# Patient Record
Sex: Female | Born: 1985
Health system: Southern US, Community
[De-identification: ages and names within clinical notes are randomized; demographics above are authoritative.]

## PROBLEM LIST (undated history)

## (undated) DIAGNOSIS — J45909 Unspecified asthma, uncomplicated: Secondary | ICD-10-CM

## (undated) DIAGNOSIS — T7840XA Allergy, unspecified, initial encounter: Secondary | ICD-10-CM

## (undated) HISTORY — DX: Allergy, unspecified, initial encounter: T78.40XA

## (undated) HISTORY — DX: Unspecified asthma, uncomplicated: J45.909

---

## 2016-07-25 ENCOUNTER — Ambulatory Visit (INDEPENDENT_AMBULATORY_CARE_PROVIDER_SITE_OTHER): Payer: BLUE CROSS/BLUE SHIELD | Admitting: Family Medicine

## 2016-07-25 ENCOUNTER — Encounter: Payer: Self-pay | Admitting: Family Medicine

## 2016-07-25 VITALS — BP 119/74 | HR 90 | Temp 98.6°F | Resp 16 | Ht 66.0 in | Wt 238.0 lb

## 2016-07-25 DIAGNOSIS — J302 Other seasonal allergic rhinitis: Secondary | ICD-10-CM

## 2016-07-25 DIAGNOSIS — Z789 Other specified health status: Secondary | ICD-10-CM

## 2016-07-25 DIAGNOSIS — Z111 Encounter for screening for respiratory tuberculosis: Secondary | ICD-10-CM | POA: Diagnosis not present

## 2016-07-25 DIAGNOSIS — J4520 Mild intermittent asthma, uncomplicated: Secondary | ICD-10-CM | POA: Diagnosis not present

## 2016-07-25 DIAGNOSIS — Z8742 Personal history of other diseases of the female genital tract: Secondary | ICD-10-CM

## 2016-07-25 DIAGNOSIS — Z23 Encounter for immunization: Secondary | ICD-10-CM

## 2016-07-25 LAB — COMPLETE METABOLIC PANEL WITH GFR
ALBUMIN: 4.5 g/dL (ref 3.6–5.1)
ALK PHOS: 44 U/L (ref 33–115)
ALT: 12 U/L (ref 6–29)
AST: 15 U/L (ref 10–30)
BUN: 12 mg/dL (ref 7–25)
CHLORIDE: 103 mmol/L (ref 98–110)
CO2: 26 mmol/L (ref 20–31)
Calcium: 9.5 mg/dL (ref 8.6–10.2)
Creat: 0.72 mg/dL (ref 0.50–1.10)
GFR, Est African American: >89 mL/min (ref >=60)
GLUCOSE: 88 mg/dL (ref 65–99)
POTASSIUM: 4.4 mmol/L (ref 3.5–5.3)
SODIUM: 138 mmol/L (ref 135–146)
Total Bilirubin: 0.3 mg/dL (ref 0.2–1.2)
Total Protein: 7.2 g/dL (ref 6.1–8.1)

## 2016-07-25 MED ORDER — ALBUTEROL SULFATE HFA 108 (90 BASE) MCG/ACT IN AERS: 2.0000 | INHALATION_SPRAY | Freq: Four times a day (QID) | RESPIRATORY_TRACT | 1 refills | Status: AC | PRN

## 2016-07-25 NOTE — Assessment & Plan Note (Signed)
Stable, triggered by allergies/URI and possible mild exercise induced component Refill Albuterol PRN inhaler

## 2016-07-25 NOTE — Assessment & Plan Note (Signed)
BMI >38, no significant family history risk factors, primary modifiable risk factors include lifestyle with limited regular exercise. Encouraged healthy lifestyle, diet/exercise Mutual decision defer lipid panel today, not fasting. Will check on follow-up at next visit age 29+

## 2016-07-25 NOTE — Assessment & Plan Note (Signed)
Resolved

## 2016-07-25 NOTE — Assessment & Plan Note (Signed)
Stable, without problems. No clear known [place] allergen triggers May use OTC anti-histamines PRN

## 2016-07-25 NOTE — Progress Notes (Signed)
Subjective:    Patient ID: Bonnie Pace, female    DOB: 23-Jul-1986, 30 y.o.   MRN: [id number]  Bonnie Pace is a 30 y.o. 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  Seasonal Allergies / H/o Mild Intermittent Asthma - Reports currently doing well, describes childhood history of asthma with one significant flare required hospitalization, thought to be due to respiratory illness. Overall used albuterol inhaler infrequently, sometimes experienced exercise induced asthma symptoms. Requests albuterol inhaler PRN. - Unsure what specific environmental allergens trigger her symptoms, has not been in [place] long enough to experience allergies here - Not taking any anti-histamine medicines currently  H/o Ovarian Cyst (Left) / OB/GYN History: - Reports previously followed by OB/GYN in [place]. Diagnosed with L ovarian cyst due to pelvic/flank pain 1 year ago, treated with hormonal pills with resolution. No further complications. Currently taking OCP, "Levolbel" from [place] which she takes for 3 weeks hormone then 1 week placebo, tolerating well, not requesting new rx or refill, not available in [place], she has plenty will wait for now.  Morbid Obesity, BMI >38 - Reports no new concerns or dramatic change in weight. Not currently following regular exercise regimen. Tries to eat healthy diet. - No known family history of high cholesterol  Health Maintenance: - Due for Flu shot today - Due for TDap today, unsure prior immunization status - No immunization records, and unsure prior MMR or Hep B vaccination status, will need titers - Will need TB screening for public school health form, opted for TB Quantiferon gold    Past Medical History:  Diagnosis Date  . Allergy   . Asthma    Social History    Social History  . Marital status: Single    Spouse name: N/A  . Number of children: N/A  . Years of education: Masters degree   Occupational History  . 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 Smoking status: Never Smoker  . Smokeless tobacco: Never Used  . Alcohol use No  . Drug use: No  . Sexual activity: Not on file   Other Topics Concern  . Not on file   Social History Narrative   Born and raised in [place], parents [place]. Moved to [place] 06/2016.   Family History  Problem Relation Age of Onset  . Ovarian cancer Mother   . Heart attack Father 64    Reported heart valve complication  . Brain cancer Maternal Grandfather    No current outpatient prescriptions on file prior to visit.   No current facility-administered medications on file prior to visit.     Review of Systems  Constitutional: Negative for activity change, appetite change, chills, diaphoresis, fatigue and fever.  HENT: Negative for congestion, hearing loss, sinus pressure, sore throat and trouble swallowing.   Eyes: Negative for visual disturbance.  Respiratory: Negative for cough, chest tightness, shortness of breath and wheezing.   Cardiovascular: Negative for chest pain, palpitations and leg swelling.  Gastrointestinal: Negative for abdominal pain, constipation, diarrhea, nausea and vomiting.  Endocrine: Negative for cold intolerance, polydipsia, polyphagia and polyuria.  Genitourinary: Negative for dysuria, frequency, hematuria, menstrual problem and vaginal discharge.  Musculoskeletal: Negative for arthralgias and back pain.  Skin: Negative for rash.  Neurological: Negative for dizziness, weakness, light-headedness, numbness and headaches.  Hematological:  Negative for adenopathy.  Psychiatric/Behavioral: Negative for behavioral problems and confusion. The patient is not nervous/anxious.    Per HPI unless specifically indicated above     Objective:    BP 119/74 (BP Location: Right  Arm, Patient Position: Sitting, Cuff Size: Large)   Pulse 90   Temp 98.6 F (37 C) (Oral)   Resp 16   Ht 5\' 6"  (1.676 m)   Wt 238 lb (108 kg)   BMI 38.41 kg/m   Wt Readings from Last 3 Encounters:  07/25/16 238 lb (108 kg)    Physical Exam  Constitutional: She is oriented to person, place, and time. She appears well-developed and well-nourished. No distress.  Well-appearing, comfortable, cooperative, very pleasant, overweight  HENT:  Head: Normocephalic and atraumatic.  Mouth/Throat: Oropharynx is clear and moist.  Eyes: Conjunctivae and EOM are normal. Pupils are equal, round, and reactive to light.  Neck: Normal range of motion. Neck supple. No thyromegaly present.  Cardiovascular: Normal rate, regular rhythm, normal heart sounds and intact distal pulses.   No murmur heard. Pulmonary/Chest: Effort normal and breath sounds normal. No respiratory distress. She has no wheezes. She has no rales.  Abdominal: Soft. Bowel sounds are normal. She exhibits no distension and no mass. There is no tenderness.  Genitourinary:  Genitourinary Comments: Deferred pelvic exam today, last pelvic / pap completed 02/2016  Musculoskeletal: Normal range of motion. She exhibits no edema or tenderness.  Back normal without deformity or abnormal curvature.  Upper / Lower Extremities: - Normal muscle tone, strength bilateral upper extremities 5/5, lower extremities 5/5 - Normal Gait - Bilateral shoulders, knees, wrist, ankles without deformity, tenderness, effusion  Lymphadenopathy:    She has no cervical adenopathy.  Neurological: She is alert and oriented to person, place, and time.  Skin: Skin is warm and dry. No rash noted. She is not diaphoretic.  Psychiatric: She has a normal mood and affect. Her behavior is normal.  Nursing note and vitals reviewed.  No results found for this or any previous visit.    Assessment & Plan:   Problem List Items Addressed This Visit    Seasonal allergies -  Primary    Stable, without problems. No clear known 03/2016 allergen triggers May use OTC anti-histamines PRN      Morbid obesity (HCC)    BMI >38, no significant family history risk factors, primary modifiable risk factors include lifestyle with limited regular exercise. Encouraged healthy lifestyle, diet/exercise Mutual decision defer lipid panel today, not fasting. Will check on follow-up at next visit age 28+      Relevant Orders   COMPLETE METABOLIC PANEL WITH GFR   Mild intermittent asthma    Stable, triggered by allergies/URI and possible mild exercise induced component Refill Albuterol PRN inhaler      Relevant Medications   albuterol (PROVENTIL HFA;VENTOLIN HFA) 108 (90 Base) MCG/ACT inhaler   History of ovarian cyst    Resolved.       Other Visit Diagnoses    Hepatitis B vaccination status unknown       Relevant Orders   Hepatitis B Surface AntiBODY   Measles, mumps, rubella (MMR) vaccination status unknown       Relevant Orders   Measles/Mumps/Rubella Immunity   Needs flu shot       Relevant Orders   Flu Vaccine QUAD 36+ mos IM (Completed)   Need for diphtheria-tetanus-pertussis (Tdap) vaccine, adult/adolescent       Relevant Orders   Tdap vaccine greater than or equal to  7yo IM (Completed)   Screening-pulmonary TB       Relevant Orders   Quantiferon tb gold assay (blood)      Meds ordered this encounter  Medications  . levonorgestrel-ethinyl estradiol (AVIANE,ALESSE,LESSINA) 0.1-20 MG-MCG tablet    Sig: Take 1 tablet by mouth daily.  Marland Kitchen albuterol (PROVENTIL HFA;VENTOLIN HFA) 108 (90 Base) MCG/ACT inhaler    Sig: Inhale 2 puffs into the lungs every 6 (six) hours as needed for wheezing or shortness of breath.    Dispense:  1 Inhaler    Refill:  1    [place] Examination Form Requirements: - Pending MMR titer status (without vaccination record) - Pending Hep B surface Ab status (without vaccination record) - If titers reactive, will update  immunization status and complete form, otherwise will require re-immunization - TB screen with Quantiferon collected, awaiting results (asymptomatic and no risk factors) - Once above requirements completed, will sign / date form and notify patient available to pick-up from office  Follow up plan: Return in about 1 year (around 07/25/2017) for annual physical.  Nobie Putnam, DO [facility] 07/25/2016, 11:21 AM

## 2016-07-25 NOTE — Patient Instructions (Signed)
Thank you for coming in to clinic today.  1. Today received Flu Shot and TDap (tetanus) shots. 2. Labs today including chemistry panel (glucose, kidney, liver), also vaccination titers to check if you are immune to Hep B and MMR, also TB blood test. - It may take 2-3 days for the lab results to finalize, and once we have all of your results, then we will complete your form and sign it, and you will be able to pick it up from our office.  Continue current birth control pill, let [place] know if you need a refill of a similar type. Also as discussed, we can provide GYN care here, but if you considered you may establish with an OBGYN as well.  Recommend to continue healthy lifestyle with regular exercise (walking / jogging, aerobic) and healthy diet with lower carb / sugars / avoid sugary drinks or sodas. Drink plenty of water.  Please schedule a follow-up appointment with Dr. Parks Ranger in 1 year for annual physical, please notify our clinic 1-2 weeks in advance that you would like to do fasting blood work in the morning, and we will check cholesterol, have results to discuss at your next visit.  If you have any other questions or concerns, please feel free to call the clinic or send a message through [employer]. You may also schedule an earlier appointment if necessary.  Nobie Putnam, DO [facility]

## 2016-07-26 LAB — HEPATITIS B SURFACE ANTIBODY,QUALITATIVE: HEP B S AB: POSITIVE — AB

## 2016-07-28 LAB — QUANTIFERON TB GOLD ASSAY (BLOOD)
Interferon Gamma Release Assay: NEGATIVE
QUANTIFERON TB AG MINUS NIL: 0 [IU]/mL
Quantiferon Nil Value: 0.04 IU/mL

## 2016-07-28 LAB — MEASLES/MUMPS/RUBELLA IMMUNITY
MUMPS IGG: 14 [AU]/ml — AB (ref <9.00)
RUBELLA: 1.34 {index} — AB (ref <0.90)
RUBEOLA IGG: 42.5 [AU]/ml — AB (ref <25.00)

## 2016-07-29 ENCOUNTER — Encounter: Payer: Self-pay | Admitting: Family Medicine

## 2017-09-26 ENCOUNTER — Emergency Department: Payer: BLUE CROSS/BLUE SHIELD

## 2017-09-26 ENCOUNTER — Encounter: Payer: Self-pay | Admitting: Emergency Medicine

## 2017-09-26 ENCOUNTER — Emergency Department
Admission: EM | Admit: 2017-09-26 | Discharge: 2017-09-26 | Disposition: A | Discharge status: Home / Self Care | Payer: BLUE CROSS/BLUE SHIELD | Attending: Emergency Medicine | Admitting: Emergency Medicine

## 2017-09-26 ENCOUNTER — Other Ambulatory Visit: Payer: Self-pay

## 2017-09-26 DIAGNOSIS — Z79899 Other long term (current) drug therapy: Secondary | ICD-10-CM | POA: Insufficient documentation

## 2017-09-26 DIAGNOSIS — J45909 Unspecified asthma, uncomplicated: Secondary | ICD-10-CM | POA: Diagnosis not present

## 2017-09-26 DIAGNOSIS — N390 Urinary tract infection, site not specified: Secondary | ICD-10-CM | POA: Diagnosis not present

## 2017-09-26 DIAGNOSIS — M545 Low back pain: Secondary | ICD-10-CM | POA: Diagnosis not present

## 2017-09-26 LAB — BASIC METABOLIC PANEL
Anion gap: 8 (ref 5–15)
BUN: 11 mg/dL (ref 6–20)
CALCIUM: 9 mg/dL (ref 8.9–10.3)
CO2: 22 mmol/L (ref 22–32)
CREATININE: 0.66 mg/dL (ref 0.44–1.00)
Chloride: 107 mmol/L (ref 101–111)
GFR calc non Af Amer: >60 mL/min (ref >60)
Glucose, Bld: 113 mg/dL — ABNORMAL HIGH (ref 65–99)
POTASSIUM: 3.5 mmol/L (ref 3.5–5.1)
SODIUM: 137 mmol/L (ref 135–145)

## 2017-09-26 LAB — URINALYSIS, COMPLETE (UACMP) WITH MICROSCOPIC
Bacteria, UA: NONE SEEN
Bilirubin Urine: NEGATIVE
Glucose, UA: NEGATIVE mg/dL
HGB URINE DIPSTICK: NEGATIVE
KETONES UR: NEGATIVE mg/dL
NITRITE: NEGATIVE
PROTEIN: NEGATIVE mg/dL
Specific Gravity, Urine: 1.024 (ref 1.005–1.030)
pH: 5 (ref 5.0–8.0)

## 2017-09-26 LAB — CBC WITH DIFFERENTIAL/PLATELET
BASOS PCT: 0 %
Basophils Absolute: 0 10*3/uL (ref 0–0.1)
EOS ABS: 0.1 10*3/uL (ref 0–0.7)
Eosinophils Relative: 1 %
HCT: 38.3 % (ref 35.0–47.0)
HEMOGLOBIN: 12.5 g/dL (ref 12.0–16.0)
Lymphocytes Relative: 20 %
Lymphs Abs: 2.1 10*3/uL (ref 1.0–3.6)
MCH: 27 pg (ref 26.0–34.0)
MCHC: 32.6 g/dL (ref 32.0–36.0)
MCV: 82.7 fL (ref 80.0–100.0)
MONOS PCT: 6 %
Monocytes Absolute: 0.7 10*3/uL (ref 0.2–0.9)
NEUTROS PCT: 73 %
Neutro Abs: 7.9 10*3/uL — ABNORMAL HIGH (ref 1.4–6.5)
PLATELETS: 312 10*3/uL (ref 150–440)
RBC: 4.63 MIL/uL (ref 3.80–5.20)
RDW: 14.3 % (ref 11.5–14.5)
WBC: 10.8 10*3/uL (ref 3.6–11.0)

## 2017-09-26 LAB — POCT PREGNANCY, URINE: PREG TEST UR: NEGATIVE

## 2017-09-26 MED ORDER — CYCLOBENZAPRINE HCL 5 MG PO TABS: 5.0000 mg | ORAL_TABLET | Freq: Three times a day (TID) | ORAL | 0 refills | Status: AC | PRN

## 2017-09-26 MED ORDER — KETOROLAC TROMETHAMINE 10 MG PO TABS: 10.0000 mg | ORAL_TABLET | Freq: Three times a day (TID) | ORAL | 0 refills | Status: AC

## 2017-09-26 MED ORDER — KETOROLAC TROMETHAMINE 30 MG/ML IJ SOLN
30.0000 mg | Freq: Once | INTRAMUSCULAR | Status: AC
  Administered 2017-09-26: 30 mg via INTRAVENOUS
  Filled 2017-09-26: qty 1

## 2017-09-26 MED ORDER — CEFTRIAXONE SODIUM 1 G IJ SOLR
1.0000 g | Freq: Once | INTRAMUSCULAR | Status: AC
  Administered 2017-09-26: 1 g via INTRAVENOUS
  Filled 2017-09-26: qty 10

## 2017-09-26 MED ORDER — CEPHALEXIN 500 MG PO CAPS: 500.0000 mg | ORAL_CAPSULE | Freq: Two times a day (BID) | ORAL | 0 refills | Status: AC

## 2017-09-26 NOTE — Discharge Instructions (Signed)
Your exam, labs, and x-ray were essentially normal at this time. You symptoms appear to represent a urinary tract infection. You are being prescribed antibiotics along with pain medicine and muscle relaxants. You should continue to monitor for any changes to your symptoms. See your provider or return to the ED as needed.

## 2017-09-26 NOTE — ED Notes (Addendum)
Pt states that she has had lower back pain x2 days that has progressively gotten worse.  Pt states that today she did not want to get out of bed.  Pt denies fevers, denies painful urination, denies any possibility of pregnancy.  Pt denies injury.  Pt states that she stands for long periods of time at work.  Pt states that the pain in her back is radiating down her L leg.  Pt is A&Ox4, in NAD. Pt walked back from triage.

## 2017-09-26 NOTE — ED Provider Notes (Signed)
[facility] Emergency Department Provider Note ____________________________________________  Time seen: [id number]  I have reviewed the triage vital signs and the nursing notes.  HISTORY  Chief Complaint  Back Pain  HPI Allen DerryLaureana Lawson is a 31 y.o. female presents to the ED for evaluation of low back pain with referral on the left leg for the last 2 days.  Patient denies any recent injury, accident, or trauma.  She also denies any fevers, chills, bowel changes, nausea, distal paresthesias, foot drop, or incontinence.  She denies dysuria, hematuria, or vaginal discharge.  She does give a history of ovarian cyst, but is currently taking oral contraceptives.  She denies any history of kidney stones or pyelonephritis.  She does report previous mild cystitis in the past.  Past Medical History:  Diagnosis Date  . Allergy   . Asthma     Patient Active Problem List   Diagnosis Date Noted  . Seasonal allergies 07/25/2016  . Mild intermittent asthma 07/25/2016  . History of ovarian cyst 07/25/2016  . Morbid obesity (HCC) 07/25/2016    History reviewed. No pertinent surgical history.  Prior to Admission medications   Medication Sig Start Date End Date Taking? Authorizing Provider  albuterol (PROVENTIL HFA;VENTOLIN HFA) 108 (90 Base) MCG/ACT inhaler Inhale 2 puffs into the lungs every 6 (six) hours as needed for wheezing or shortness of breath. 07/25/16   Karamalegos, Netta NeatAlexander J, DO  cephALEXin (KEFLEX) 500 MG capsule Take 1 capsule (500 mg total) 2 (two) times daily for 7 days by mouth. 09/26/17 10/03/17  Isley Zinni, Charlesetta IvoryJenise V Bacon, PA-C  cyclobenzaprine (FLEXERIL) 5 MG tablet Take 1 tablet (5 mg total) 3 (three) times daily as needed by mouth for muscle spasms. 09/26/17   Jalessa Peyser, Charlesetta IvoryJenise V Bacon, PA-C  ketorolac (TORADOL) 10 MG tablet Take 1 tablet (10 mg total) every 8 (eight) hours by mouth. 09/26/17   Brighten Buzzelli, Charlesetta IvoryJenise V Bacon, PA-C  levonorgestrel-ethinyl estradiol  (AVIANE,ALESSE,LESSINA) 0.1-20 MG-MCG tablet Take 1 tablet by mouth daily.    [provider]    Allergies Patient has no known allergies.  Family History  Problem Relation Age of Onset  . Ovarian cancer Mother   . Heart attack Father [phone]       Reported heart valve complication  . Brain cancer Maternal Grandfather     Social History Social History   Tobacco Use  . Smoking status: Never Smoker  . Smokeless tobacco: Never Used  Substance Use Topics  . Alcohol use: No  . Drug use: No    Review of Systems  Constitutional: Negative for fever. Cardiovascular: Negative for chest pain. Respiratory: Negative for shortness of breath. Gastrointestinal: Negative for abdominal pain, vomiting and diarrhea. Genitourinary: Negative for dysuria. Musculoskeletal: Positive for back pain. Skin: Negative for rash. Neurological: Negative for headaches, focal weakness or numbness. ____________________________________________  PHYSICAL EXAM:  VITAL SIGNS: ED Triage Vitals  Enc Vitals Group     BP 09/26/17 1820 (!) 169/107     Pulse Rate 09/26/17 1820 (!) 115     Resp 09/26/17 1820 18     Temp 09/26/17 1820 97.9 F (36.6 C)     Temp Source 09/26/17 1820 Oral     SpO2 09/26/17 1820 95 %     Weight 09/26/17 1822 242 lb 8.1 oz (110 kg)     Height 09/26/17 1822 5\' 7"  (1.702 m)     Head Circumference --      Peak Flow --      Pain Score 09/26/17  1820 9     Pain Loc --      Pain Edu? --      Excl. in GC? --     Constitutional: Alert and oriented. Well appearing and in no distress. Head: Normocephalic and atraumatic. Neck: Supple. No thyromegaly. Cardiovascular: Normal rate, regular rhythm. Normal distal pulses. Respiratory: Normal respiratory effort. No wheezes/rales/rhonchi. Gastrointestinal: Soft and nontender. No distention. Patient is tender to palpation however to the left flank. Musculoskeletal: Normal spinal alignment without midline tenderness, spasm, deformity, or  step-off.  She has a negative seated straight leg raise.  Normal hip flexion and extension range on exam.  Nontender with normal range of motion in all extremities.  Neurologic: Cranial nerves II through XII grossly intact.  Normal LE DTRs bilaterally.  Normal toe dorsiflexion foot eversion noted.  Normal gait without ataxia. Normal speech and language. No gross focal neurologic deficits are appreciated. Skin:  Skin is warm, dry and intact. No rash noted. ____________________________________________   LABS (pertinent positives/negatives)  Labs Reviewed  URINALYSIS, COMPLETE (UACMP) WITH MICROSCOPIC - Abnormal; Notable for the following components:      Result Value   Color, Urine YELLOW (*)    APPearance HAZY (*)    Leukocytes, UA MODERATE (*)    Squamous Epithelial / LPF 6-30 (*)    All other components within normal limits  BASIC METABOLIC PANEL - Abnormal; Notable for the following components:   Glucose, Bld 113 (*)    All other components within normal limits  CBC WITH DIFFERENTIAL/PLATELET - Abnormal; Notable for the following components:   Neutro Abs 7.9 (*)    All other components within normal limits  URINE CULTURE  POC URINE PREG, ED  POCT PREGNANCY, URINE  ____________________________________________   RADIOLOGY  Lumbar Spine IMPRESSION: No acute fracture or dislocation identified.  Mild loss of disc space height at the L4-5 and L5-S1 level. ____________________________________________  PROCEDURES  IV lock Toradol 30 mg IV Rocephin 1 g IVPB ____________________________________________  INITIAL IMPRESSION / ASSESSMENT AND PLAN / ED COURSE  Patient with ED evaluation of low back pain with some referral to the lateral left thigh.  On exam however the patient was found to be tender with palpation to lower abdomen and pelvis as well as the left flank.  Her exam seem less consistent with an acute ovarian cyst, particularly given that she is on oral contraceptives  regularly.  She is also about 3 weeks into her menstrual cycle.  No vaginal discharge or abnormal vaginal bleeding.  Her exam is overall benign but her labs did reveal a moderate leukocyturia.  We did plain film x-rays of lower back since she had not been worked up in the past for this or a similar pain.  X-ray was overall benign showing only some mild DDD at L4-5 and L5-S1.  Patient without any neuromuscular deficit or significant radicular symptoms.  She reports improvement of her symptoms following IV pain medicine administration.  Since she brought herself into the ED she will be discharged with prescriptions for Rocephin, ketorolac, and Flexeril.  She is advised to follow-up with primary care provider or OB/GYN provider for ongoing symptoms.  Return precautions are reviewed. ____________________________________________  FINAL CLINICAL IMPRESSION(S) / ED DIAGNOSES  Final diagnoses:  Lower urinary tract infectious disease      Karmen StabsMenshew, Charlesetta IvoryJenise V Bacon, PA-C 09/26/17 2222    Nita SickleVeronese, [place], MD 09/26/17 2344

## 2017-09-26 NOTE — ED Triage Notes (Signed)
Low back pain radiating L leg x 2 days. Denies injury at onset.

## 2017-09-26 NOTE — ED Notes (Signed)
Patient tolerated IV medication without incident.

## 2017-09-29 LAB — URINE CULTURE
Culture: 50000 — AB
SPECIAL REQUESTS: NORMAL

## 2017-12-21 ENCOUNTER — Ambulatory Visit
Admission: RE | Admit: 2017-12-21 | Discharge: 2017-12-21 | Disposition: A | Discharge status: Home / Self Care | Payer: BLUE CROSS/BLUE SHIELD | Source: Ambulatory Visit | Attending: Family Medicine | Admitting: Family Medicine

## 2017-12-21 ENCOUNTER — Encounter: Payer: Self-pay | Admitting: Family Medicine

## 2017-12-21 ENCOUNTER — Ambulatory Visit: Payer: BLUE CROSS/BLUE SHIELD | Admitting: Family Medicine

## 2017-12-21 VITALS — BP 129/77 | HR 99 | Temp 98.8°F | Resp 16 | Ht 65.75 in | Wt 262.0 lb

## 2017-12-21 DIAGNOSIS — L290 Pruritus ani: Secondary | ICD-10-CM

## 2017-12-21 DIAGNOSIS — Z23 Encounter for immunization: Secondary | ICD-10-CM | POA: Diagnosis not present

## 2017-12-21 DIAGNOSIS — M79672 Pain in left foot: Secondary | ICD-10-CM

## 2017-12-21 DIAGNOSIS — B80 Enterobiasis: Secondary | ICD-10-CM

## 2017-12-21 MED ORDER — ALBENDAZOLE 200 MG PO TABS: 400.0000 mg | ORAL_TABLET | ORAL | 0 refills | Status: DC

## 2017-12-21 MED ORDER — NAPROXEN 500 MG PO TABS: 500.0000 mg | ORAL_TABLET | Freq: Two times a day (BID) | ORAL | 2 refills | Status: AC

## 2017-12-21 NOTE — Progress Notes (Signed)
Subjective:    Patient ID: Bonnie Pace, female    DOB: 01-Jul-1986, 32 y.o.   MRN: [id number]  Bonnie Pace is a 32 y.o. female presenting on 12/21/2017 for Foot Pain (left onset 3 days )  Patient presents for a same day appointment. Last appointment in 07/2016  HPI   She has two new complaints today.  Left Foot Pain, Acute Reports new complaint with Left dorsal foot pain of mid foot over arch. She did not have any injury but states she started a [employer] for exercise - started 4 weeks ago, with increased exercises compared to previous was not exercising regularly. Her goal is to lose weight. She did not notice any problems like this before starting the boot camp, and did not have prior foot pain before. She does have history of high arches that she was aware of and wore mostly flats, never wore heels. She did not endorse actual injury or problem, but noticed a gradual worsening Left foot pain over past few days to week, thought to be more minor ache and soreness, had some foot muscle cramps at times, thought related to hydration and limited water, now improving. - Now acute worsening pain in past 24 hours, with same location more significant moderate to severe pain affecting her ambulation, difficulty with pain with weight bearing on Left foot. Walking is worse. 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 - Never wore heel or arch supports before, no prior fractures - Denies other joint pain, swelling, redness, bruising, numbness, tingling weakness  Perianal Itching Recent concern over past 4-6 weeks with new symptom, perianal itching and has required scratching. She was concerned about possible exposure from trip to [place] from Christmas through early January 5th. No symptoms prior to trip. No known exposure. She ate street food and other things but unsure of onset. She did not have gastro symptoms on the trip. - Currently her  itching symptoms are constant, not seem to be nocturnal only - Never had similar episode before, no known infection with parasite or worms - She has not had prior hemorrhoids or anal fissure, but tried OTC Preparation H cream as needed recently without relief - In past has only had some mild intermittent diarrhea but no other related symptoms and none currently - Denies any rectal or anal bleeding, blood in stool, constipation, straining, nausea vomiting, diarrhea  Health Maintenance: Due for Flu Shot, will receive today    [facility] 12/21/2017 07/25/2016  Decreased Interest 0 0  Down, Depressed, Hopeless 0 0  PHQ - 2 Score 0 0    Social History   Tobacco Use  . Smoking status: Never Smoker  . Smokeless tobacco: Never Used  Substance Use Topics  . Alcohol use: No  . Drug use: No    Review of Systems Per HPI unless specifically indicated above     Objective:    BP 129/77   Pulse 99   Temp 98.8 F (37.1 C) (Oral)   Resp 16   Ht 5' 5.75" (1.67 m)   Wt 262 lb (118.8 kg)   LMP 12/07/2017 (Approximate)   BMI 42.61 kg/m   Wt Readings from Last 3 Encounters:  12/21/17 262 lb (118.8 kg)  09/26/17 242 lb 8.1 oz (110 kg)  07/25/16 238 lb (108 kg)    Physical Exam  Constitutional: She is oriented to person, place, and time. She appears well-developed  and well-nourished. No distress.  Well-appearing, comfortable, cooperative, obese  HENT:  Head: Normocephalic and atraumatic.  Mouth/Throat: Oropharynx is clear and moist.  Eyes: Conjunctivae are normal. Right eye exhibits no discharge. Left eye exhibits no discharge.  Neck: Normal range of motion. Neck supple.  Cardiovascular: Normal rate and intact distal pulses.  Pulmonary/Chest: Effort normal.  Abdominal: Soft. Bowel sounds are normal. She exhibits no distension.  Genitourinary:  Genitourinary Comments: Declined external rectal exam today.  Musculoskeletal: She exhibits no edema.  Feet / Lower  ext Inspection: Bilateral feet similar appearance, without asymmetry, bruising swelling or redness. Both arches are consistent with pes cavus on standing Palpation: localized tenderness across mid to lateral Left dorsal metatarsal arch. Otherwise nontender great toe, MTP, forefoot, hindfoot, medial heel, achilles, ankle malleoli ROM: full active ROM Strength: distal intact, limited plantarflex due to pain Neurovascular: distal intact  Able to weight bear without assistance but has some slight antalgic gait  Neurological: She is alert and oriented to person, place, and time.  Skin: Skin is warm and dry. No rash noted. She is not diaphoretic. No erythema.  Psychiatric: She has a normal mood and affect. Her behavior is normal.  Well groomed, good eye contact, normal speech and thoughts  Nursing note and vitals reviewed.  Results for orders placed or performed during the hospital encounter of 09/26/17  Urine culture  Result Value Ref Range   Specimen Description URINE, CLEAN CATCH    Special Requests Normal    Culture 50,000 COLONIES/mL STREPTOCOCCUS GROUP C (A)    Report Status 09/29/2017 FINAL   Urinalysis, Complete w Microscopic  Result Value Ref Range   Color, Urine YELLOW (A) YELLOW   APPearance HAZY (A) CLEAR   Specific Gravity, Urine 1.024 1.005 - 1.030   pH 5.0 5.0 - 8.0   Glucose, UA NEGATIVE NEGATIVE mg/dL   Hgb urine dipstick NEGATIVE NEGATIVE   Bilirubin Urine NEGATIVE NEGATIVE   Ketones, ur NEGATIVE NEGATIVE mg/dL   Protein, ur NEGATIVE NEGATIVE mg/dL   Nitrite NEGATIVE NEGATIVE   Leukocytes, UA MODERATE (A) NEGATIVE   RBC / HPF 0-5 0 - 5 RBC/hpf   WBC, UA 6-30 0 - 5 WBC/hpf   Bacteria, UA NONE SEEN NONE SEEN   Squamous Epithelial / LPF 6-30 (A) NONE SEEN   Mucus PRESENT   Basic metabolic panel  Result Value Ref Range   Sodium 137 135 - 145 mmol/L   Potassium 3.5 3.5 - 5.1 mmol/L   Chloride 107 101 - 111 mmol/L   CO2 22 22 - 32 mmol/L   Glucose, Bld 113 (H) 65 -  99 mg/dL   BUN 11 6 - 20 mg/dL   Creatinine, Ser 1.61 0.44 - 1.00 mg/dL   Calcium 9.0 8.9 - 09.6 mg/dL   GFR calc non Af Amer >60 >60 mL/min   GFR calc Af Amer >60 >60 mL/min   Anion gap 8 5 - 15  CBC with Differential  Result Value Ref Range   WBC 10.8 3.6 - 11.0 K/uL   RBC 4.63 3.80 - 5.20 MIL/uL   Hemoglobin 12.5 12.0 - 16.0 g/dL   HCT 04.5 40.9 - 81.1 %   MCV 82.7 80.0 - 100.0 fL   MCH 27.0 26.0 - 34.0 pg   MCHC 32.6 32.0 - 36.0 g/dL   RDW 91.4 78.2 - 95.6 %   Platelets 312 150 - 440 K/uL   Neutrophils Relative % 73 %   Neutro Abs 7.9 (H) 1.4 - 6.5 K/uL  Lymphocytes Relative 20 %   Lymphs Abs 2.1 1.0 - 3.6 K/uL   Monocytes Relative 6 %   Monocytes Absolute 0.7 0.2 - 0.9 K/uL   Eosinophils Relative 1 %   Eosinophils Absolute 0.1 0 - 0.7 K/uL   Basophils Relative 0 %   Basophils Absolute 0.0 0 - 0.1 K/uL  Pregnancy, urine POC  Result Value Ref Range   Preg Test, Ur NEGATIVE NEGATIVE      Assessment & Plan:   Problem List Items Addressed This Visit    None    Visit Diagnoses    Perianal itch    -  Primary Consistent with localized pruritic issue, recent onset, after international travel Higher risk for possible worm infection likely pinworm given description Cannot rule out anal fissure vs hemorrhoids, seems possible fissure, given limited relief on hemorrhoidal cream Declined exam, limited diagnostic ability today  Plan: 1. Discussion on options - agree to try oral antiparasite treatment for Pinworm - Albendazole 400mg  dose (x 2 of 200mg ) on day 1 then repeat in 2 weeks - If not improving can notify office - she should check at home for sign of anal fissure as discussed - if no sign of anal fissure then will treat with new rx with topical corticosteroid Triamcinolone 0.025% cream per recommendations for this area of skin. BID x 2 weeks, can repeat in 2 weeks if needed - If see anal fissure at home, self exam - recommend new rx Diltiazem Gel 2% apply topical to anal  fissure TID for 7-10 days until healed, to promote smooth muscle relaxing and blood flow for healing - regardless can try Sitz Baths or warm bathtub soaks to help skin heal and keep area clean - Avoid constipation and straining, recommend high fiber diet, improve hydration     Relevant Medications   albendazole (ALBENZA) 200 MG tablet   Needs flu shot       Relevant Orders   Flu Vaccine QUAD 6+ mos PF IM (Fluarix Quad PF) (Completed)   Pinworm infection    - presumed     Relevant Medications   albendazole (ALBENZA) 200 MG tablet   Left foot pain     Concern given localized pain over metatarsal region possible strain vs cannot rule out stress fracture with recent high intensity work-out boot camp. No obvious deformity or injury. Most suspicious of overuse injury. - At risk with known pes cavus, and seems to be some improper footwear limited support - Not consistent with plantar fascia or other foot/ankle localized injuries  Plan Check L foot x-ray today, will review results, contact patient Trial Naproxen NSAID 500 BID 1-2 weeks then PRN Tylenol, RICE therapy PRN May need support with walker boot or other post-op shoe - handout given for med supply store options if fracture or if difficulty with ambulation still No note required, she will not return to [facility] for now Follow-up as needed 4-6 weeks, may consider Podiatry sooner if not healing as expected     Relevant Medications   naproxen (NAPROSYN) 500 MG tablet   Other Relevant Orders   DG Foot Complete Left      Meds ordered this encounter  Medications  . albendazole (ALBENZA) 200 MG tablet    Sig: Take 2 tablets (400 mg total) by mouth every 14 (fourteen) days. Take on empty stomach in morning. Repeat dose in 2 weeks then done    Dispense:  4 tablet    Refill:  0  . naproxen (  NAPROSYN) 500 MG tablet    Sig: Take 1 tablet (500 mg total) by mouth 2 (two) times daily with a meal. For 2-4 weeks then as needed    Dispense:   60 tablet    Refill:  2    Follow up plan: Return if symptoms worsen or fail to improve, for foot pain .  Saralyn Pilar, DO [facility] 12/21/2017, 5:42 PM

## 2017-12-21 NOTE — Patient Instructions (Addendum)
Thank you for coming to the office today.  1.  Concern for possible pinworm infection as source of itching - try the antibiotic medicine as prescribed x 2 pills on day one, empty stomach, then repeat in 2 weeks.  In meantime, if not improved after 1 week, then can try topical steroid (either OTC cortisone) or can notify office for stronger steroid topical rx  OR if you notice that you find an area that looks like an "Anal Fissure" or break in the skin, then notify office and we can send in a different rx such as Diltiazem to help this heal, as healing skin in this area can cause itching  Diltiazem Gel 2% apply topical to anal fissure TID for 7-10 days until healed, to promote smooth muscle relaxing and blood flow for healing  ----------------------------------------------  Recommend trial of Anti-inflammatory with Naproxen (Naprosyn) 500mg  tabs - take one with food and plenty of water TWICE daily every day (breakfast and dinner), for next 1-2 weeks, then you may take only as needed - DO NOT TAKE any ibuprofen, aleve, motrin while you are taking this medicine - It is safe to take Tylenol Ext Str 500mg  tabs - take 1 to 2 (max dose 1000mg ) every 6 hours as needed for breakthrough pain, max 24 hour daily dose is 6 to 8 tablets or 4000mg   X-ray today - stay tuned for results tomorrow  Avoid excessive walking or stress on foot  [facility] [place] [place] Address: [address], [place], [place] [address] Open until 6PM Phone: [phone]  [facility] [address] [place], [place] [address] Open until 5PM Phone: [phone]   If needed can refer to Podiatrist for 2nd opinion.  ---------------------------------------------------  Please schedule a Follow-up Appointment to: Return if symptoms worsen or fail to improve, for foot pain .  If you have any other questions or concerns, please feel free to call the office or send a message through MyChart.  You may also schedule an earlier appointment if necessary.  Additionally, you may be receiving a survey about your experience at our office within a few days to 1 week by e-mail or mail. We value your feedback.  Saralyn PilarAlexander Aracelie Addis, DO [facility]

## 2018-01-23 DIAGNOSIS — H109 Unspecified conjunctivitis: Secondary | ICD-10-CM | POA: Diagnosis not present

## 2018-01-23 DIAGNOSIS — J019 Acute sinusitis, unspecified: Secondary | ICD-10-CM | POA: Diagnosis not present

## 2018-01-23 DIAGNOSIS — T7840XA Allergy, unspecified, initial encounter: Secondary | ICD-10-CM | POA: Diagnosis not present

## 2018-09-12 IMAGING — CR DG LUMBAR SPINE COMPLETE 4+V
1 series · 5 of 5 positions shown · non-contrast
Comparison: None.

CLINICAL DATA: 31 y/o  F; progressive lower back pain for 2 days.

EXAM:
LUMBAR SPINE - COMPLETE 4+ VIEW

[Series 1: dg lumbar spine complete 4 +v · 0.14mm/px · 5 of 5 slices shown]
[im 1/5]
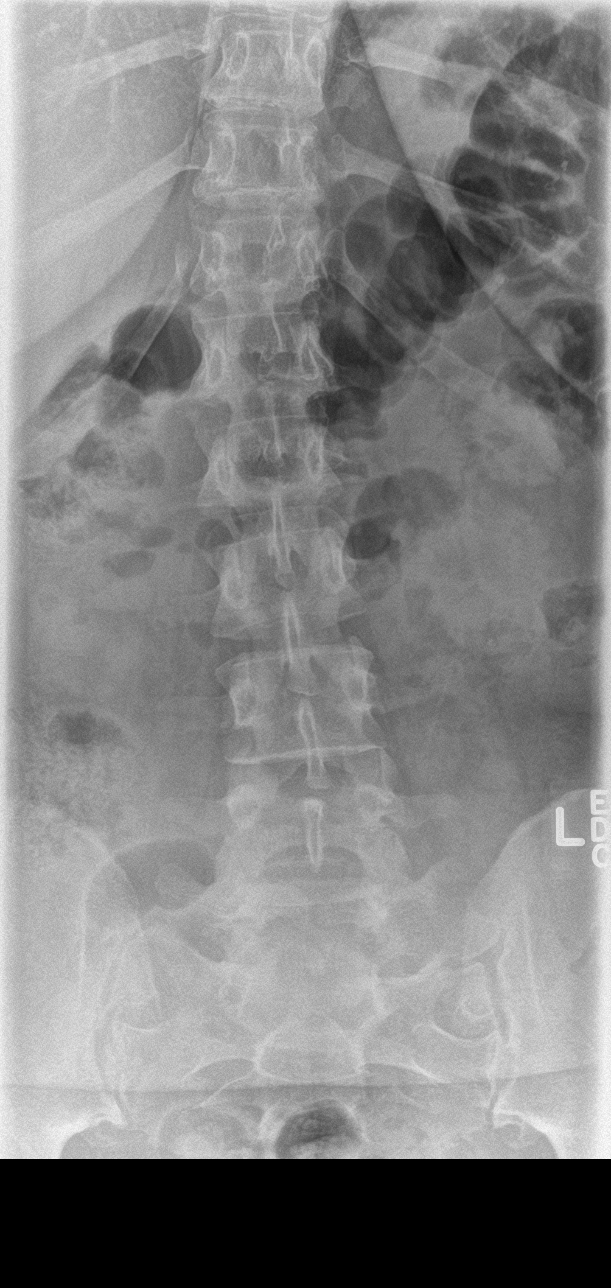
[im 2/5]
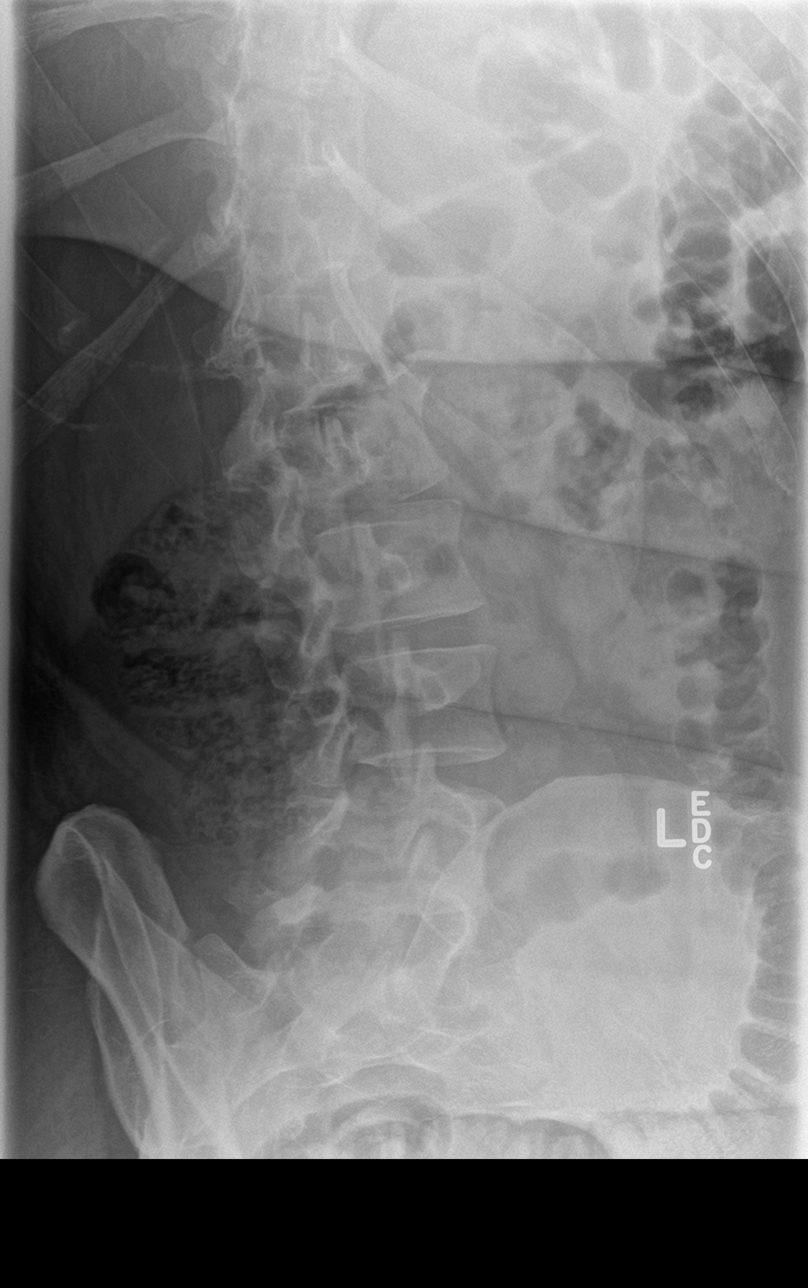
[im 3/5]
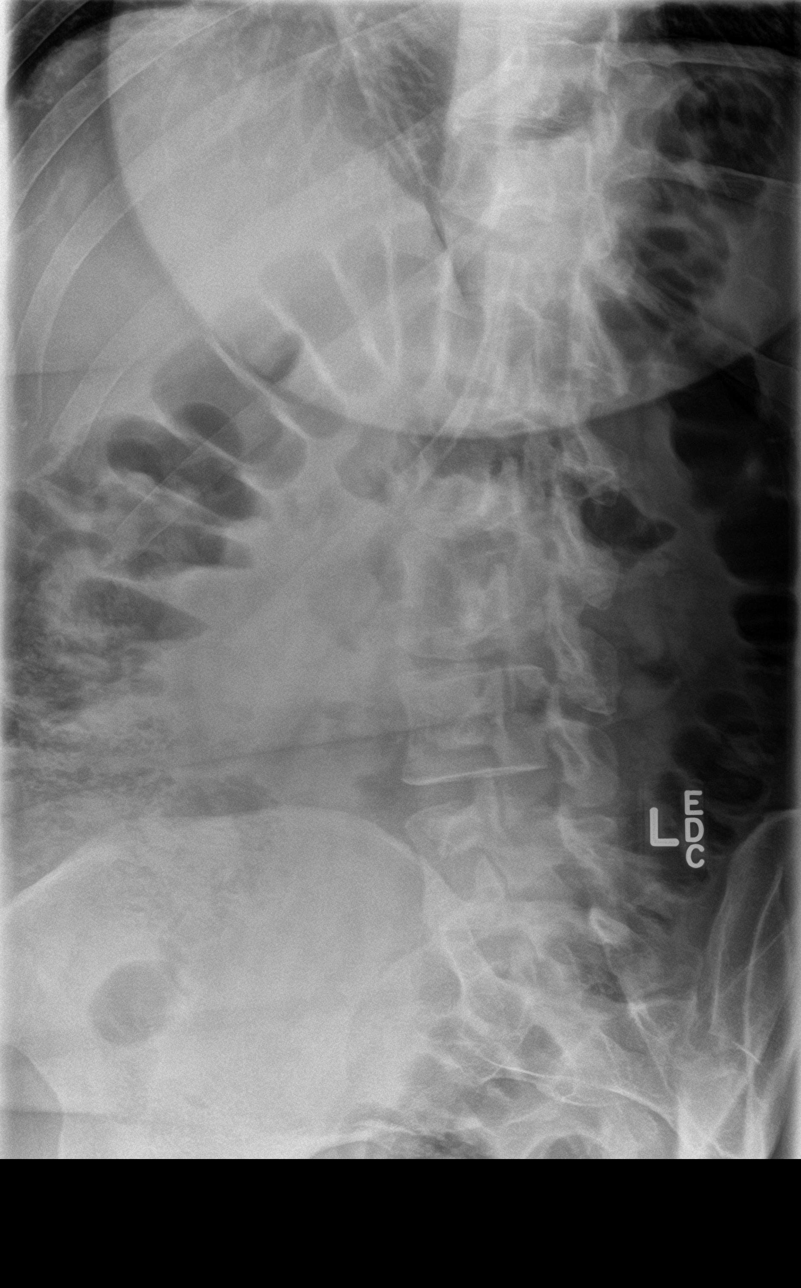
[im 4/5]
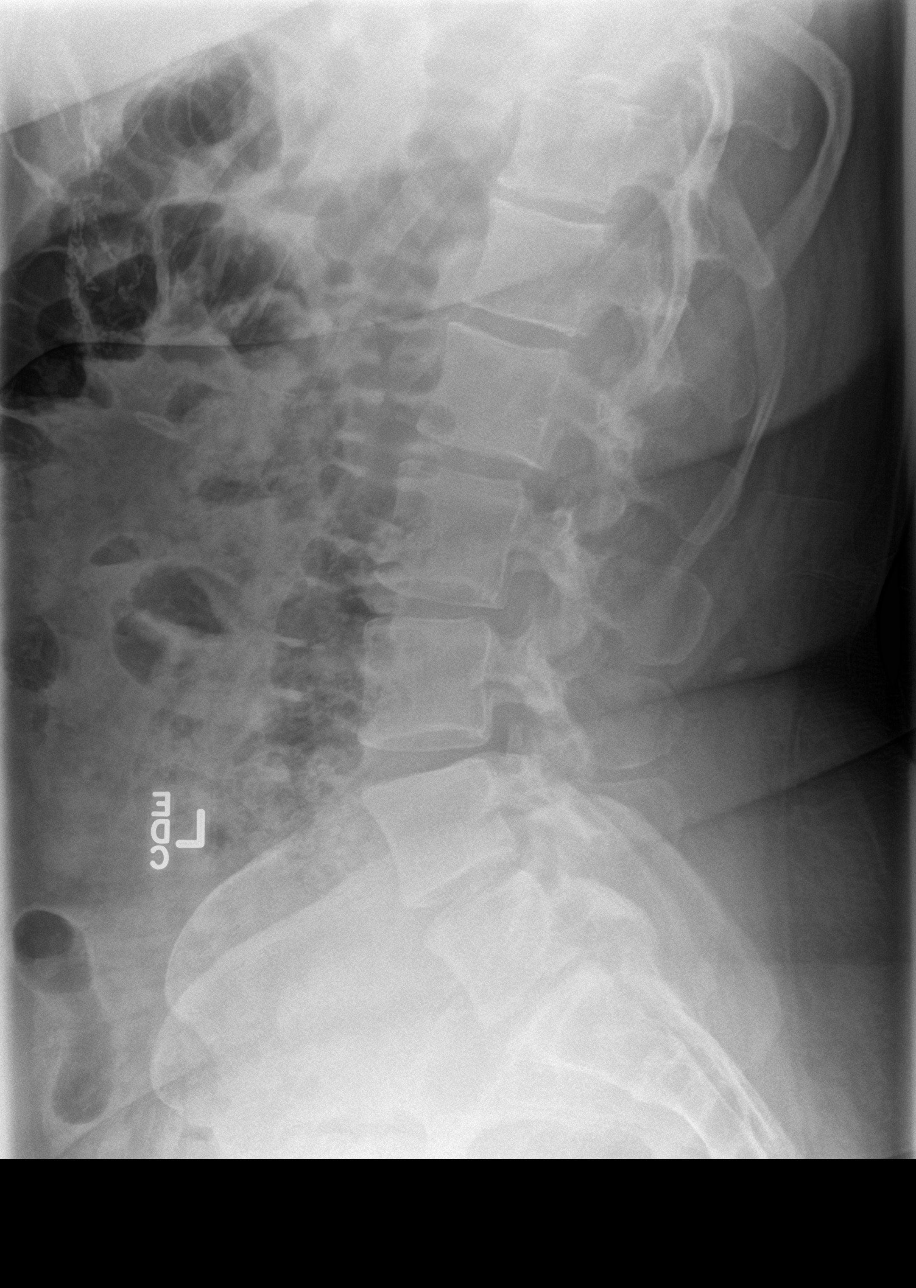
[im 5/5]
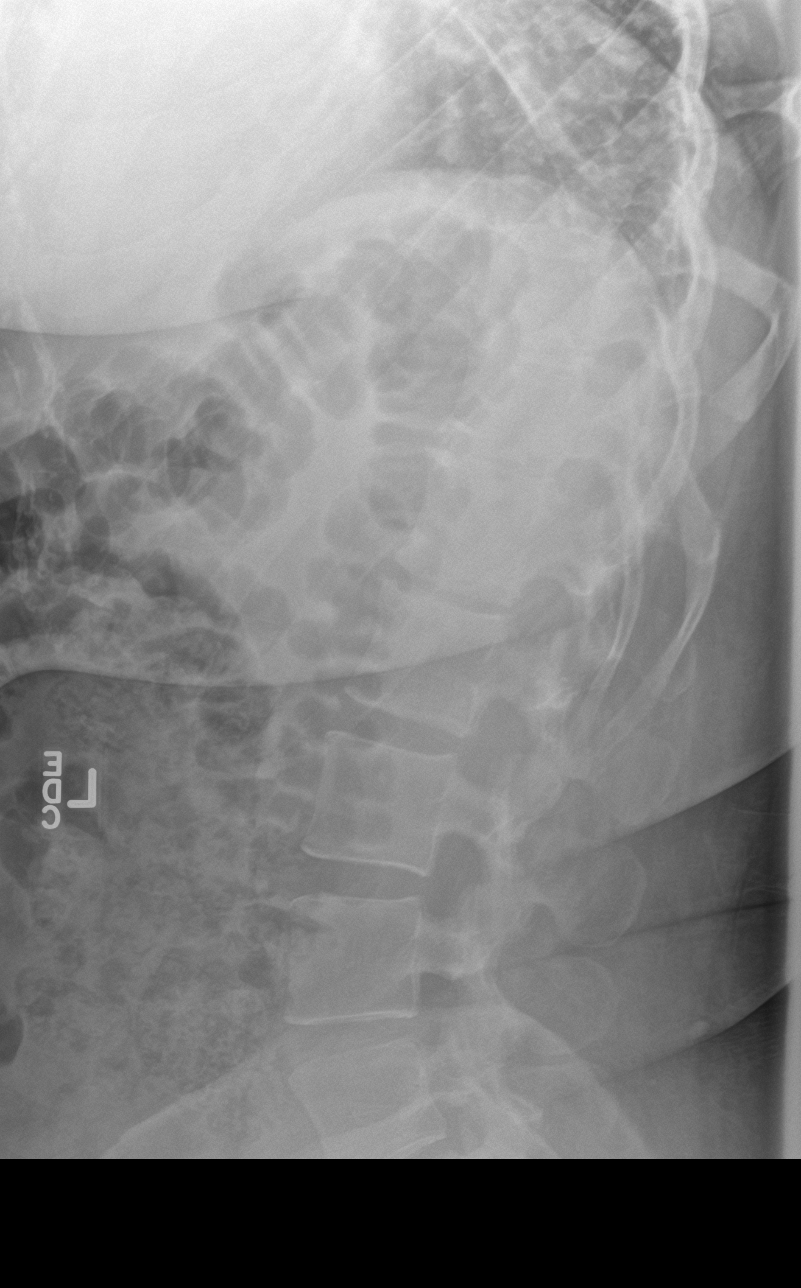

[5 of 5 positions shown; findings below may reference images not displayed]

FINDINGS: There is no evidence of lumbar spine fracture. Alignment is normal.
Mild L4-5 and L5-S1 intervertebral disc space height loss. Mild
dextrocurvature thoracolumbar junction.
IMPRESSION: No acute fracture or dislocation identified.

Mild loss of disc space height at the L4-5 and L5-S1 level.

By: Sus Metcalf M.D.

## 2018-10-18 ENCOUNTER — Ambulatory Visit: Payer: BLUE CROSS/BLUE SHIELD | Admitting: Family Medicine

## 2018-10-18 ENCOUNTER — Encounter: Payer: Self-pay | Admitting: Family Medicine

## 2018-10-18 VITALS — BP 145/73 | HR 93 | Temp 98.9°F | Resp 16 | Ht 65.5 in | Wt 257.0 lb

## 2018-10-18 DIAGNOSIS — H6691 Otitis media, unspecified, right ear: Secondary | ICD-10-CM

## 2018-10-18 MED ORDER — AMOXICILLIN 500 MG PO CAPS: 500.0000 mg | ORAL_CAPSULE | Freq: Two times a day (BID) | ORAL | 0 refills | Status: DC

## 2018-10-18 NOTE — Patient Instructions (Addendum)
Thank you for coming to the office today.   Otitis Media, Adult Otitis media occurs when there is inflammation and fluid in the middle ear. Your middle ear is a part of the ear that contains bones for hearing as well as air that helps send sounds to your brain. What are the causes? This condition is caused by a blockage in the eustachian tube. This tube drains fluid from the ear to the back of the nose (nasopharynx). A blockage in this tube can be caused by an object or by swelling (edema) in the tube. Problems that can cause a blockage include:  A cold or other upper respiratory infection.  Allergies.  An irritant, such as tobacco smoke.  Enlarged adenoids. The adenoids are areas of soft tissue located high in the back of the throat, behind the nose and the roof of the mouth.  A mass in the nasopharynx.  Damage to the ear caused by pressure changes (barotrauma).  What are the signs or symptoms? Symptoms of this condition include:  Ear pain.  A fever.  Decreased hearing.  A headache.  Tiredness (lethargy).  Fluid leaking from the ear.  Ringing in the ear.  How is this diagnosed? This condition is diagnosed with a physical exam. During the exam your health care provider will use an instrument called an otoscope to look into your ear and check for redness, swelling, and fluid. He or she will also ask about your symptoms. Your health care provider may also order tests, such as:  A test to check the movement of the eardrum (pneumatic otoscopy). This test is done by squeezing a small amount of air into the ear.  A test that changes air pressure in the middle ear to check how well the eardrum moves and whether the eustachian tube is working (tympanogram).  How is this treated? This condition usually goes away on its own within 3-5 days. But if the condition is caused by a bacteria infection and does not go away own its own, or keeps coming back, your health care provider  may:  Prescribe antibiotic medicines to treat the infection.  Prescribe or recommend medicines to control pain.  Follow these instructions at home:  Take over-the-counter and prescription medicines only as told by your health care provider.  If you were prescribed an antibiotic medicine, take it as told by your health care provider. Do not stop taking the antibiotic even if you start to feel better.  Keep all follow-up visits as told by your health care provider. This is important. Contact a health care provider if:  You have bleeding from your nose.  There is a lump on your neck.  You are not getting better in 5 days.  You feel worse instead of better. Get help right away if:  You have severe pain that is not controlled with medicine.  You have swelling, redness, or pain around your ear.  You have stiffness in your neck.  A part of your face is paralyzed.  The bone behind your ear (mastoid) is tender when you touch it.  You develop a severe headache. Summary  Otitis media is redness, soreness, and swelling of the middle ear.  This condition usually goes away on its own within 3-5 days.  If the problem does not go away in 3-5 days, your health care provider may prescribe or recommend medicines to treat your symptoms.  If you were prescribed an antibiotic medicine, take it as told by your health care  provider. This information is not intended to replace advice given to you by your health care provider. Make sure you discuss any questions you have with your health care provider. Document Released: 08/08/2004 Document Revised: 10/24/2016 Document Reviewed: 10/24/2016 Elsevier Interactive Patient Education  2018 [employer]   Please schedule a Follow-up Appointment to: Return in about 1 week (around 10/25/2018) for AOM.  If you have any other questions or concerns, please feel free to call the office or send a message through MyChart. You may also schedule an earlier  appointment if necessary.  Additionally, you may be receiving a survey about your experience at our office within a few days to 1 week by e-mail or mail. We value your feedback.  Saralyn PilarAlexander , DO [facility], [facility]

## 2018-10-18 NOTE — Progress Notes (Signed)
Subjective:    Patient ID: Bonnie Pace, female    DOB: [birth year], 32 y.o.   MRN: [id number]  Bonnie DerryLaureana Marquard is a 32 y.o. female presenting on 10/18/2018 for Ear Pain (Right side onset 3 days as per patient had cold last week)  Patient presents for a same day appointment.  HPI   Acute Otitis media / Right Ear Pain / Hearing Reduce / Pressure Reports recently had a URI cold symptom >1 week ago that improved and now has resolved. But she has developed over past 3 days some R sided ear pain and fullness, she describes sore to touch on outer ear and feels "cotton-wool" feeling inside ear, and some reduced hearing recently. - She took OTC Cold & Flu medicine recently, last dose Saturday - Admits mild headache, episodic - Denies any fevers, chills, sweats  Health Maintenance: [facility]  [facility] 10/18/2018 12/21/2017 07/25/2016  Decreased Interest 0 0 0  Down, Depressed, Hopeless 0 0 0  PHQ - 2 Score 0 0 0    Social History   Tobacco Use  . Smoking status: Never Smoker  . Smokeless tobacco: Never Used  Substance Use Topics  . Alcohol use: No  . Drug use: No    Review of Systems Per HPI unless specifically indicated above     Objective:    BP (!) 145/73   Pulse 93   Temp 98.9 F (37.2 C) (Oral)   Resp 16   Ht 5' 5.5" (1.664 m)   Wt 257 lb (116.6 kg)   BMI 42.12 kg/m   Wt Readings from Last 3 Encounters:  10/18/18 257 lb (116.6 kg)  12/21/17 262 lb (118.8 kg)  09/26/17 242 lb 8.1 oz (110 kg)    Physical Exam  Constitutional: She is oriented to person, place, and time. She appears well-developed and well-nourished. No distress.  Well-appearing, comfortable, cooperative  HENT:  Head: Normocephalic and atraumatic.  Mouth/Throat: Oropharynx is clear and moist.  Frontal / maxillary sinuses non-tender. Nares patent without purulence or edema.  R TM with erythema and some purulent effusion, no canal lesions. Localized  external ear tenderness anteriorly and inferiorly, mild localized LAD positive.  L TM normal.  Oropharynx with mild R sided local erythema with eustachian tube otherwise no exudates, edema or asymmetry.  Eyes: Conjunctivae are normal. Right eye exhibits no discharge. Left eye exhibits no discharge.  Cardiovascular: Normal rate.  Pulmonary/Chest: Effort normal.  Musculoskeletal: She exhibits no edema.  Neurological: She is alert and oriented to person, place, and time.  Skin: Skin is warm and dry. No rash noted. She is not diaphoretic. No erythema.  Psychiatric: She has a normal mood and affect. Her behavior is normal.  Well groomed, good eye contact, normal speech and thoughts  Nursing note and vitals reviewed.      Assessment & Plan:   Problem List Items Addressed This Visit    None    Visit Diagnoses    Right acute otitis media    -  Primary   Relevant Medications   amoxicillin (AMOXIL) 500 MG capsule      Consistent with R acute AOM on exam with effusion. Without perforation, in setting of prior URI symptoms with known sick contacts. No recent URI or antibiotics. No prior known AOM. Currently afebrile, well-appearing and non-toxic, well hydrated on exam.  Plan: 1. Start Amoxicillin 500 BID x 10 days 2. Symptomatic medication relief as needed OTC 3. Return criteria  given   Meds ordered this encounter  Medications  . amoxicillin (AMOXIL) 500 MG capsule    Sig: Take 1 capsule (500 mg total) by mouth 2 (two) times daily. For 10 days    Dispense:  20 capsule    Refill:  0     Follow up plan: Return in about 1 week (around 10/25/2018) for AOM.   Saralyn Pilar, DO [facility] 10/18/2018, 2:14 PM

## 2018-12-07 IMAGING — DX DG FOOT COMPLETE 3+V*L*
3 series · 3 of 3 positions shown · non-contrast
Comparison: None.

CLINICAL DATA: Foot pain following working out, no known injury,
initial encounter

EXAM:
LEFT FOOT - COMPLETE 3+ VIEW

[foot ap]
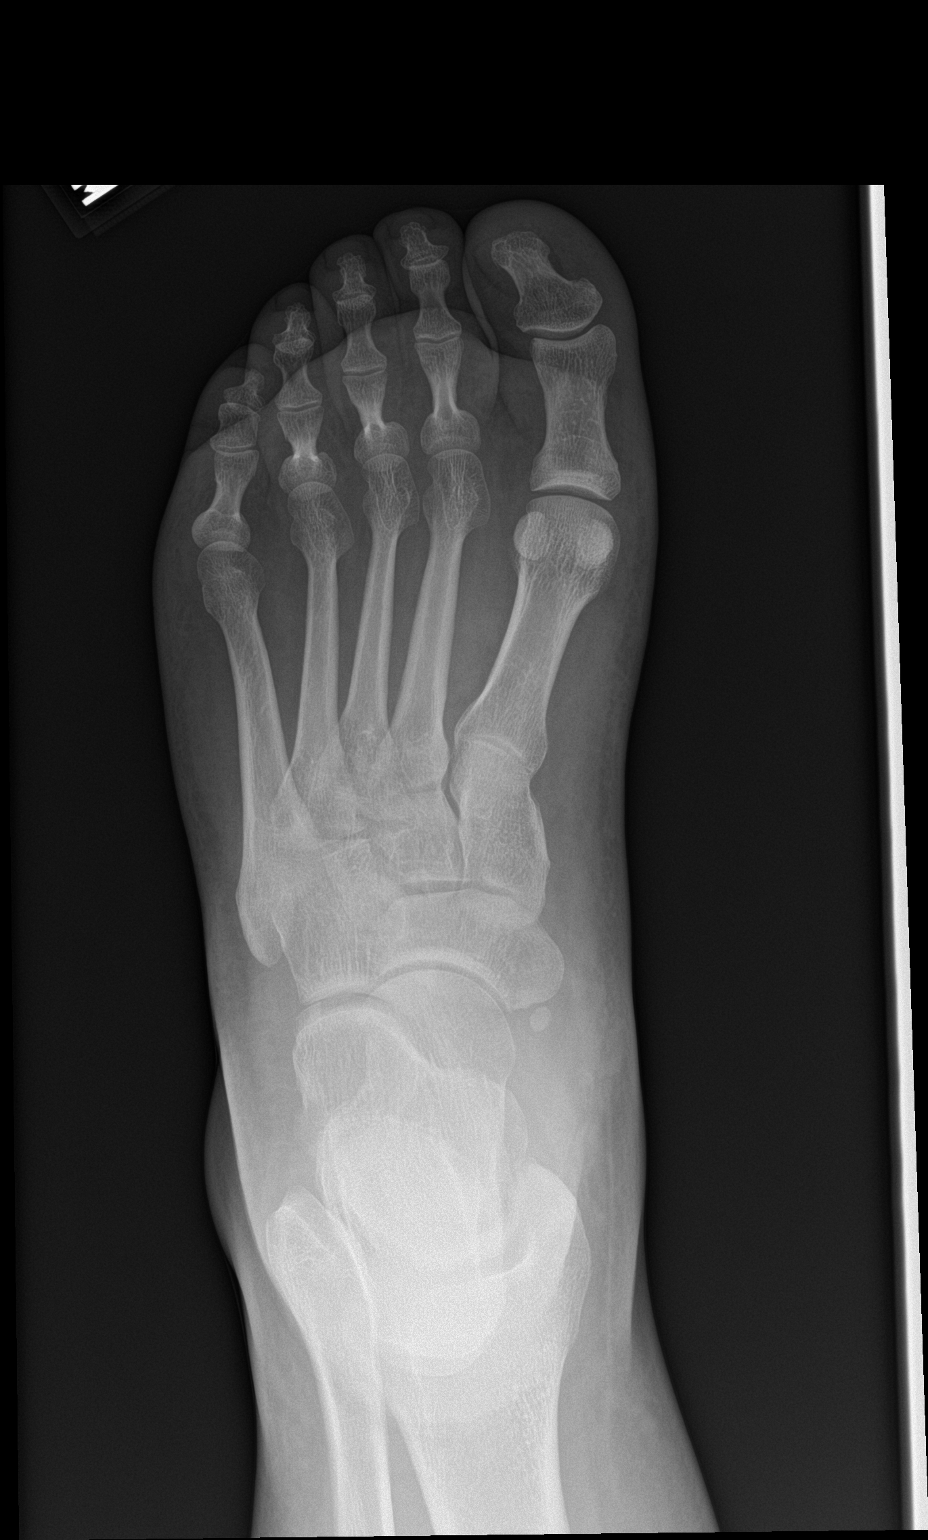

[foot obl]
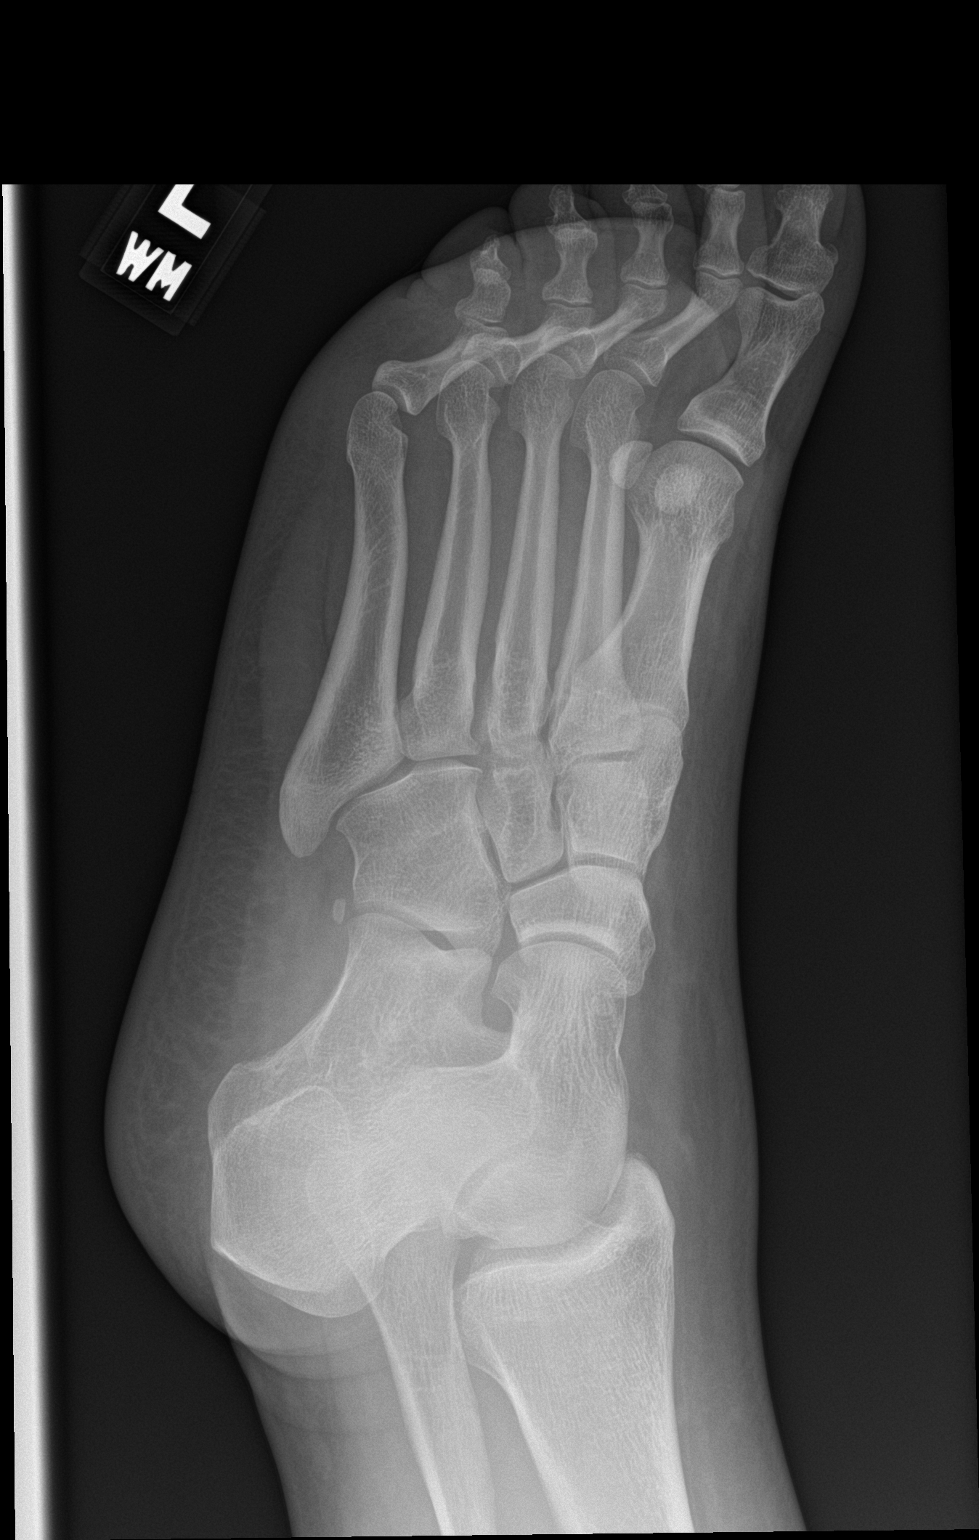

[foot lat]
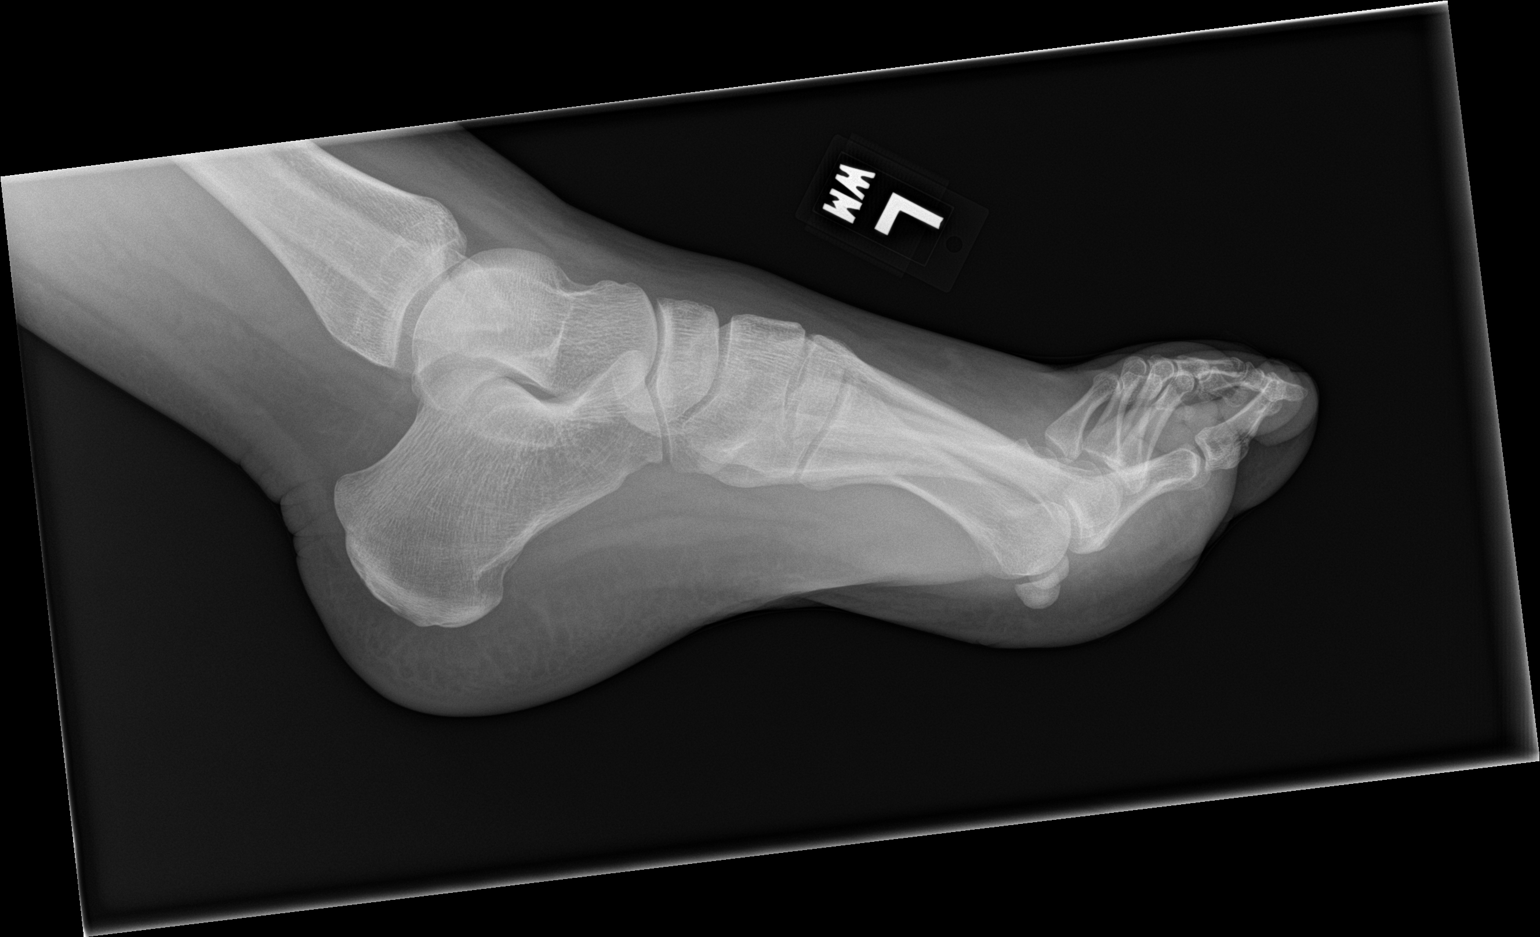

[3 of 3 positions shown; findings below may reference images not displayed]

FINDINGS: There is no evidence of fracture or dislocation. There is no
evidence of arthropathy or other focal bone abnormality. Soft
tissues are unremarkable.
IMPRESSION: No acute abnormality noted.

## 2019-02-04 ENCOUNTER — Telehealth (INDEPENDENT_AMBULATORY_CARE_PROVIDER_SITE_OTHER): Payer: BLUE CROSS/BLUE SHIELD

## 2019-02-04 DIAGNOSIS — J4521 Mild intermittent asthma with (acute) exacerbation: Secondary | ICD-10-CM | POA: Diagnosis not present

## 2019-02-04 DIAGNOSIS — R0602 Shortness of breath: Secondary | ICD-10-CM

## 2019-02-04 DIAGNOSIS — R509 Fever, unspecified: Secondary | ICD-10-CM | POA: Diagnosis not present

## 2019-02-04 MED ORDER — PREDNISONE 50 MG PO TABS: 50.0000 mg | ORAL_TABLET | Freq: Every day | ORAL | 0 refills | Status: AC

## 2019-02-04 NOTE — Telephone Encounter (Signed)
Patient called with fever 100.1 today, coughing for 2 days chest tightness.

## 2019-02-04 NOTE — Telephone Encounter (Addendum)
As per patient denied exposure to COVID 19 but has fever 100.1 F on and off from last night, has cough and chest tightness but denies chills.

## 2019-02-04 NOTE — Telephone Encounter (Signed)
TELEPHONIC MEDICAL VISIT Location: [facility] Provider: Saralyn Pilar, DO ---------------------------------------------------------------------- CC: Fever, Cough, Chest tightness  S: Reviewed CMA telephone note below. I have called patient and gathered additional HPI as follows:  Reports that symptoms started last night 02/03/19, had some cold sweats, checked on thermometer this morning with fever up to 100.65F. She has history of asthma mild intermittent, feels different than prior asthma but more upper chest. She describes cough as dry and non productive. - Tried OTC Tylenol to reduce fever - Only sick contact was a friend with a cough 1 week ago but they have recovered - She has a close contact who is on chemo and asks about risk to them  Denies any high risk travel to areas of current concern for COVID19. Denies any known or suspected exposure to person with or possibly with COVID19.  Patient is a Scientist, research (life sciences). Currently work is closed, she has been self quarantining mostly.  Admits shortness of breath even with limited exertion, with coughing Admits sweating episodes with fever Denies any body ache, sinus pain or pressure, headache, abdominal pain, diarrhea  -------------------------------------------------------------------------- O: No physical exam performed due to remote telephone encounter.  -------------------------------------------------------------------------- A&P: Suspected POSSIBLE COVID-19 case vs Viral URI with bronchospasm asthma exacerbation  - Concerning with some high risk symptoms, fever, dyspnea - Comorbid intermittent asthma at higher risk  - TECHNICALLY STILL LOW RISK for COVID19 based on no known travel/exposure - given my clinical suspicions and her worsening acute onset symptoms I am recommending testing for this patient.  1. Ordered COVID-19 testing swab from [facility] - drive up to [facility] health tent site in [place] [address] - she will try to go today if not then tomorrow  Trial on prednisone burst 50mg  daily x 5 days as well for asthma She declines albuterol inhaler due to cost  Self quarantine - advised to avoid all exposure with others while during treatment. Should continue to quarantine for up to 7-14 days, pending resolution of symptoms OR NEGATIVE TEST RESULT, if symptoms resolve by 7 days and is afebrile >48 hours - may STOP self quarantine at that time.   If symptoms do not resolve or improve - and still fever / cough - or worsening dyspnea - then should contact us and seek advice - and may warrant Hospital ED if worsening concerns shortness of breath not improving.  Patient verbalizes understanding with the above medical recommendations including the limitation of remote medical advice.  Specific follow-up / return / call-back criteria were given for patient to follow-up or seek medical care more urgently if needed.  Disclosed to patient at start of encounter that we will bill telephone services and she will receive bill of services provided.  Patient consents to be treated via phone prior to discussion. - Patient is at home and is accessed via telephone. - Services are provided from [facility]. - Time spent in direct consultation with patient on phone: 15  Saralyn Pilar, DO [facility] 02/04/2019, 3:30 PM

## 2019-02-08 ENCOUNTER — Telehealth: Payer: Self-pay | Admitting: Family Medicine

## 2019-02-08 NOTE — Telephone Encounter (Signed)
Patient has sent mychart message today 02/08/19 requesting follow up test result from her [employer] on Friday 3/20.  She has not heard back on result. I do not see it available in system.  Could you call [facility] to check status of this result - how we will receive it or how they will notify patient?  Also - Wilhelmina Mcardle, AGPCNP-BC also ordered a similar test for one of her patients on 02/03/19 we would like to check status on that as well. I will provide you the MRN.  Saralyn Pilar, DO [facility] 02/08/2019, 2:24 PM

## 2019-02-08 NOTE — Telephone Encounter (Signed)
Spoke to [facility] result is not ready yet will take another 3-4 days, Patient informed.

## 2019-02-21 LAB — NOVEL CORONAVIRUS, NAA: SARS-COV-2, NAA: NOT DETECTED
